# Patient Record
Sex: Female | Born: 1950 | Race: Black or African American | Hispanic: No | Smoking: Never smoker
Health system: Southern US, Community
[De-identification: ages and names within clinical notes are randomized; demographics above are authoritative.]

## PROBLEM LIST (undated history)

## (undated) DIAGNOSIS — J45909 Unspecified asthma, uncomplicated: Secondary | ICD-10-CM

## (undated) DIAGNOSIS — M719 Bursopathy, unspecified: Secondary | ICD-10-CM

## (undated) HISTORY — PX: ECTOPIC PREGNANCY SURGERY: SHX613

## (undated) HISTORY — PX: FOOT SURGERY: SHX648

---

## 2001-07-12 ENCOUNTER — Ambulatory Visit (HOSPITAL_COMMUNITY): Admission: RE | Admit: 2001-07-12 | Discharge: 2001-07-12 | Payer: Self-pay | Admitting: Obstetrics and Gynecology

## 2001-07-12 ENCOUNTER — Encounter: Payer: Self-pay | Admitting: Obstetrics and Gynecology

## 2007-05-01 ENCOUNTER — Ambulatory Visit (HOSPITAL_COMMUNITY): Payer: Self-pay | Admitting: Psychiatry

## 2007-05-08 ENCOUNTER — Ambulatory Visit (HOSPITAL_COMMUNITY): Payer: Self-pay | Admitting: Psychiatry

## 2007-05-17 ENCOUNTER — Ambulatory Visit (HOSPITAL_COMMUNITY): Payer: Self-pay | Admitting: Psychiatry

## 2007-05-24 ENCOUNTER — Ambulatory Visit (HOSPITAL_COMMUNITY): Payer: Self-pay | Admitting: Psychiatry

## 2007-06-15 ENCOUNTER — Ambulatory Visit (HOSPITAL_COMMUNITY): Payer: Self-pay | Admitting: Psychiatry

## 2007-06-28 ENCOUNTER — Ambulatory Visit (HOSPITAL_COMMUNITY): Payer: Self-pay | Admitting: Psychiatry

## 2008-09-12 ENCOUNTER — Ambulatory Visit (HOSPITAL_COMMUNITY): Admission: RE | Admit: 2008-09-12 | Discharge: 2008-09-12 | Payer: Self-pay | Admitting: Family Medicine

## 2009-10-05 ENCOUNTER — Ambulatory Visit (HOSPITAL_COMMUNITY): Admission: RE | Admit: 2009-10-05 | Discharge: 2009-10-05 | Payer: Self-pay | Admitting: Family Medicine

## 2012-12-10 ENCOUNTER — Ambulatory Visit (HOSPITAL_COMMUNITY)
Admission: RE | Admit: 2012-12-10 | Discharge: 2012-12-10 | Disposition: A | Discharge status: Home / Self Care | Payer: Self-pay | Source: Ambulatory Visit | Attending: Family Medicine | Admitting: Family Medicine

## 2012-12-10 ENCOUNTER — Other Ambulatory Visit (HOSPITAL_COMMUNITY): Payer: Self-pay | Admitting: General Practice

## 2012-12-10 DIAGNOSIS — M25552 Pain in left hip: Secondary | ICD-10-CM

## 2012-12-10 DIAGNOSIS — M25559 Pain in unspecified hip: Secondary | ICD-10-CM | POA: Insufficient documentation

## 2014-07-19 IMAGING — CR DG HIP (WITH OR WITHOUT PELVIS) 2-3V*L*
3 series · 3 of 3 positions shown · non-contrast
Comparison: 11/07/2011

CLINICAL DATA: Left hip pain

LEFT HIP - COMPLETE 2+ VIEW

[view not recorded (1 of 3)]
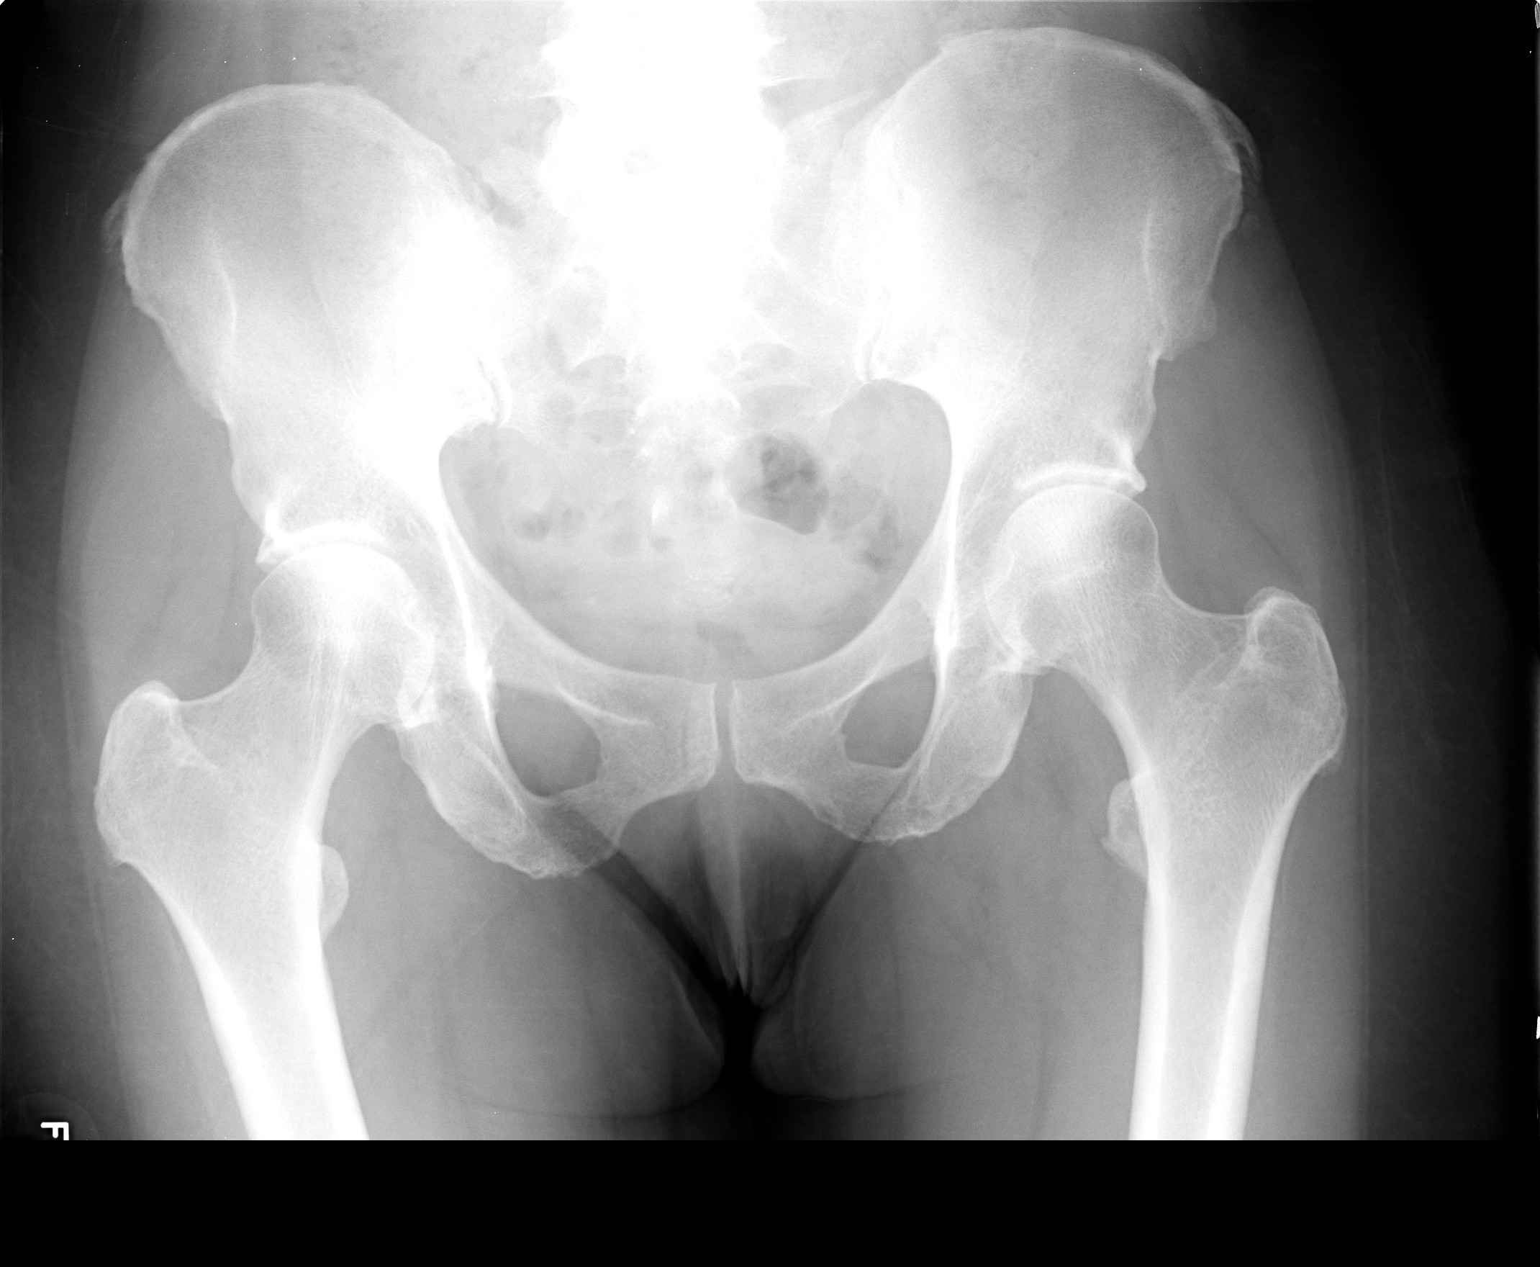

[view not recorded (2 of 3)]
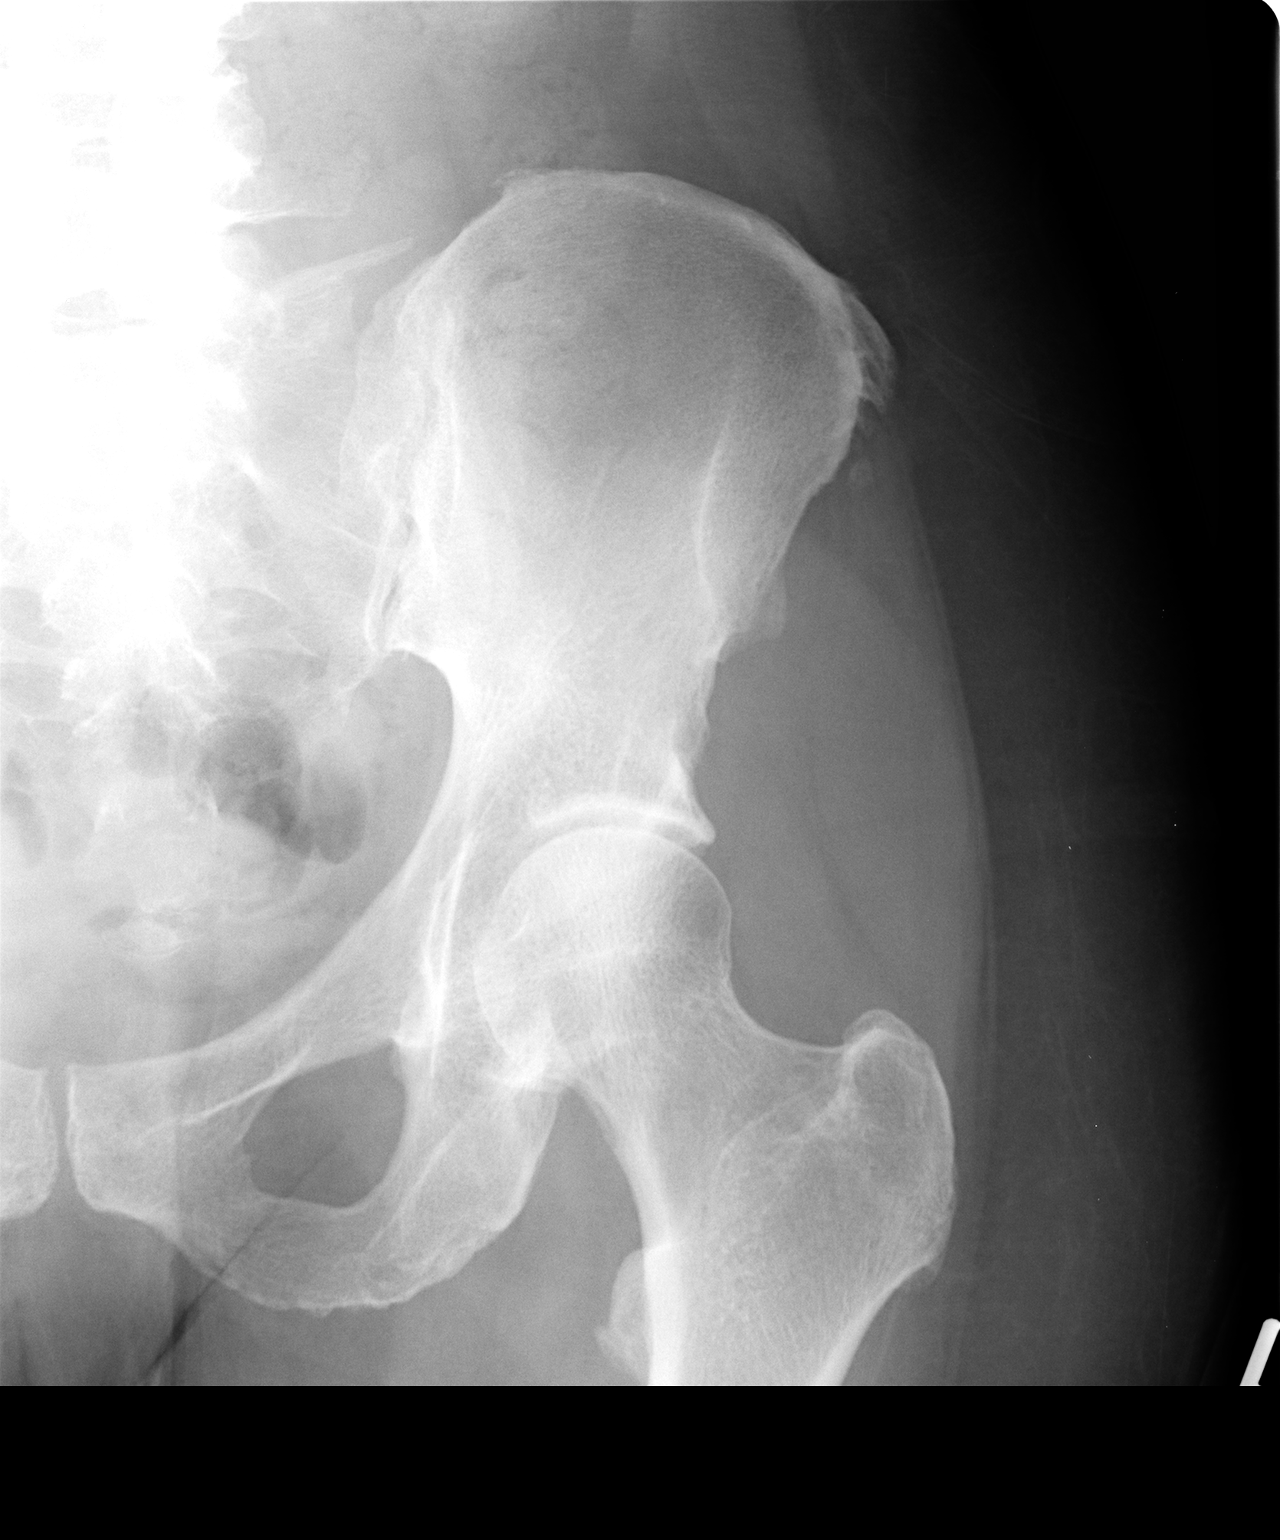

[view not recorded (3 of 3)]
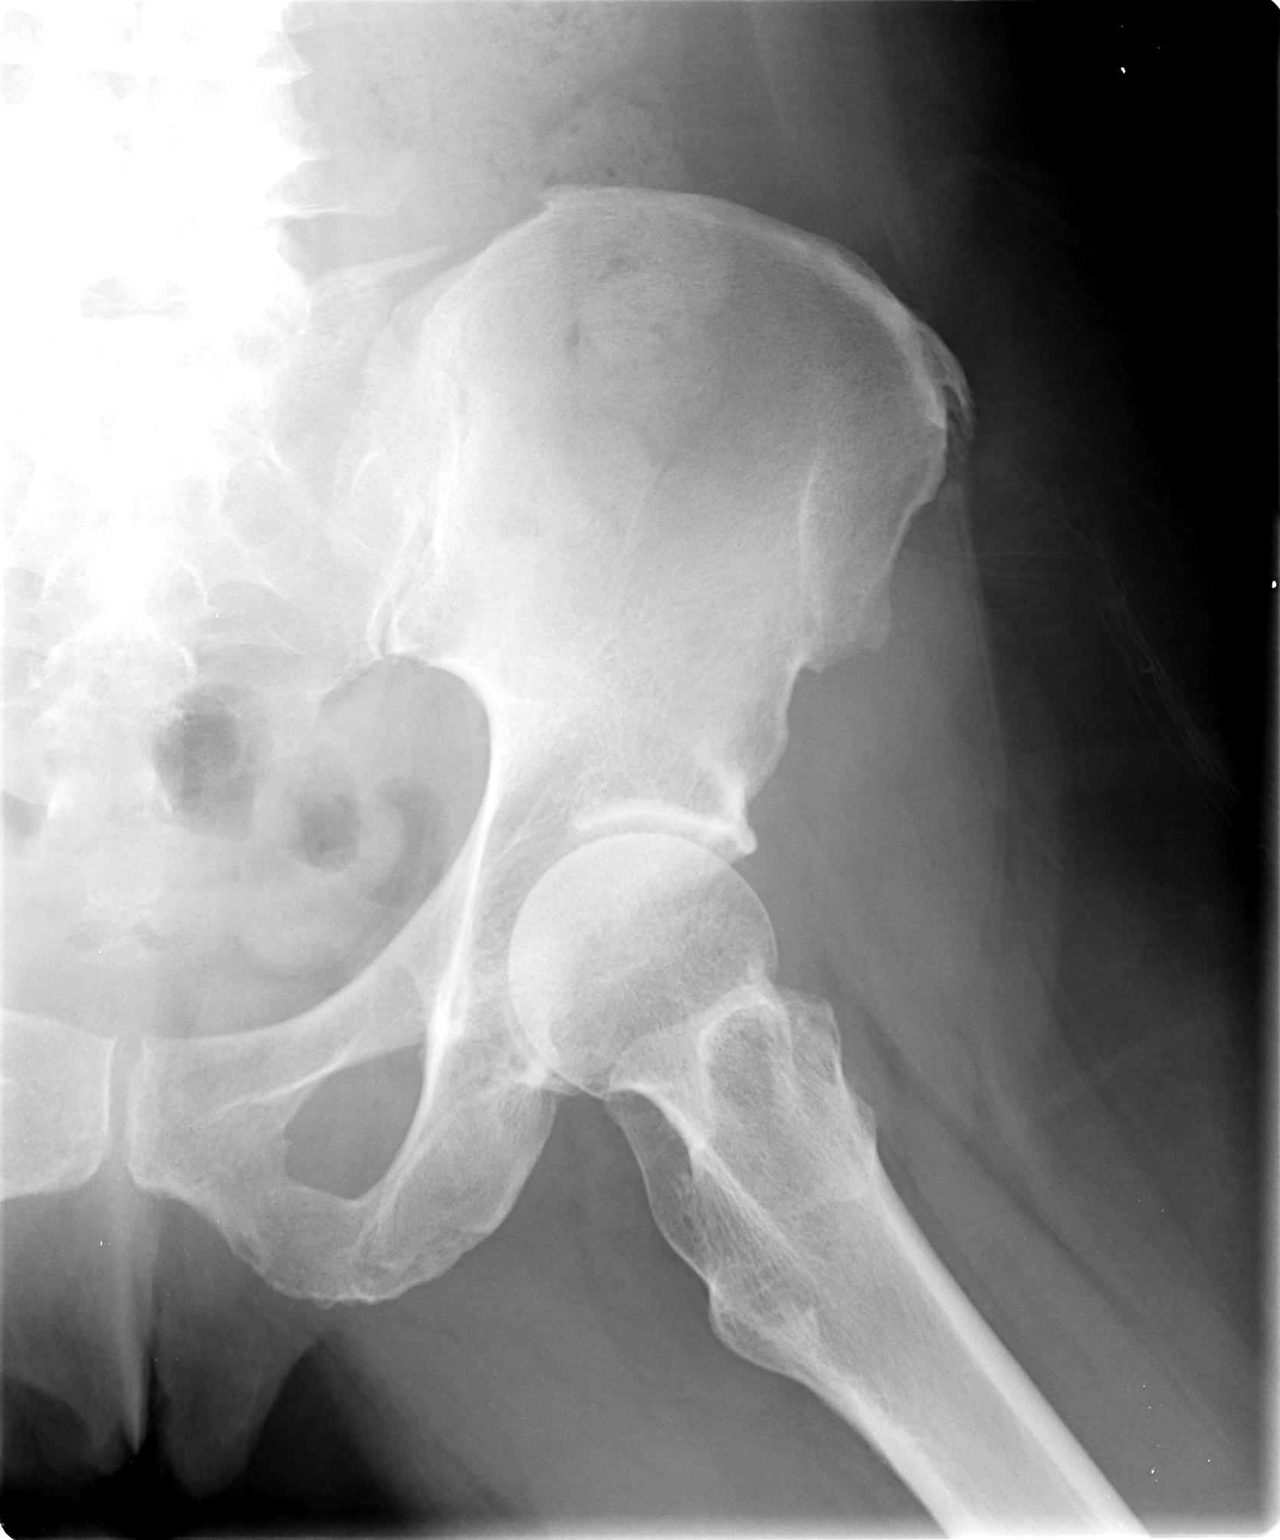

[3 of 3 positions shown; findings below may reference images not displayed]

FINDINGS: Three views of the left hip submitted.  No acute fracture or
subluxation.  Mild degenerative changes bilateral hip joints with
mild narrowing superior joint space.  Mild bilateral superior
acetabular spurring.  Mild spurring of the iliac wings bilaterally.
IMPRESSION: No acute fracture or subluxation.  Mild degenerative changes as
described above.

## 2014-08-14 ENCOUNTER — Emergency Department (HOSPITAL_COMMUNITY)
Admission: EM | Admit: 2014-08-14 | Discharge: 2014-08-14 | Disposition: A | Discharge status: Home / Self Care | Attending: Emergency Medicine | Admitting: Emergency Medicine

## 2014-08-14 ENCOUNTER — Encounter (HOSPITAL_COMMUNITY): Payer: Self-pay | Admitting: *Deleted

## 2014-08-14 DIAGNOSIS — M25552 Pain in left hip: Secondary | ICD-10-CM | POA: Diagnosis not present

## 2014-08-14 DIAGNOSIS — M25511 Pain in right shoulder: Secondary | ICD-10-CM | POA: Diagnosis present

## 2014-08-14 DIAGNOSIS — M2550 Pain in unspecified joint: Secondary | ICD-10-CM

## 2014-08-14 DIAGNOSIS — J45909 Unspecified asthma, uncomplicated: Secondary | ICD-10-CM | POA: Diagnosis not present

## 2014-08-14 HISTORY — DX: Unspecified asthma, uncomplicated: J45.909

## 2014-08-14 HISTORY — DX: Bursopathy, unspecified: M71.9

## 2014-08-14 MED ORDER — KETOROLAC TROMETHAMINE 60 MG/2ML IM SOLN
60.0000 mg | Freq: Once | INTRAMUSCULAR | Status: AC
  Administered 2014-08-14: 60 mg via INTRAMUSCULAR
  Filled 2014-08-14: qty 2

## 2014-08-14 MED ORDER — TRAMADOL HCL 50 MG PO TABS: 50.0000 mg | ORAL_TABLET | Freq: Four times a day (QID) | ORAL | Status: DC | PRN

## 2014-08-14 MED ORDER — PREDNISONE 20 MG PO TABS: 40.0000 mg | ORAL_TABLET | Freq: Every day | ORAL | Status: DC

## 2014-08-14 NOTE — ED Provider Notes (Signed)
CSN: [id number]     Arrival date & time 08/14/14  1321 History   First MD Initiated Contact with Patient 08/14/14 1623     Chief Complaint  Patient presents with  . Shoulder Pain     (Consider location/radiation/quality/duration/timing/severity/associated sxs/prior Treatment) HPI Comments: Patient here complaining of 2 weeks of pain to her left shoulder left hip which are atraumatic. No fever or chills. No history of trauma. History of similar symptoms associated with bursitis and she was treated with tramadol. Denies any paresthesias to her left hand or left leg. No bowel or bladder dysfunction. Symptoms characterized as sharp pain worse with movement and better with rest. Has used over-the-counter medications without relief.  Patient is a 64 y.o. female presenting with shoulder pain. The history is provided by the patient.  Shoulder Pain   Past Medical History  Diagnosis Date  . Asthma   . Bursitis    Past Surgical History  Procedure Laterality Date  . Ectopic pregnancy surgery    . Foot surgery     History reviewed. No pertinent family history. History  Substance Use Topics  . Smoking status: Never Smoker   . Smokeless tobacco: Not on file  . Alcohol Use: No   OB History    No data available     Review of Systems  All other systems reviewed and are negative.     Allergies  Review of patient's allergies indicates no known allergies.  Home Medications   Prior to Admission medications   Not on File   BP 153/76 mmHg  Pulse 78  Temp(Src) 98.3 F (36.8 C) (Oral)  Resp 16  Ht 5\' 2"  (1.575 m)  Wt 124 lb (56.246 kg)  BMI 22.67 kg/m2  SpO2 100% Physical Exam  Constitutional: She is oriented to person, place, and time. She appears well-developed and well-nourished.  Non-toxic appearance. No distress.  HENT:  Head: Normocephalic and atraumatic.  Eyes: Conjunctivae, EOM and lids are normal. Pupils are equal, round, and reactive to light.  Neck: Normal range of  motion. Neck supple. No tracheal deviation present. No thyroid mass present.  Cardiovascular: Normal rate, regular rhythm and normal heart sounds.  Exam reveals no gallop.   No murmur heard. Pulmonary/Chest: Effort normal and breath sounds normal. No stridor. No respiratory distress. She has no decreased breath sounds. She has no wheezes. She has no rhonchi. She has no rales.  Abdominal: Soft. Normal appearance and bowel sounds are normal. She exhibits no distension. There is no tenderness. There is no rebound and no CVA tenderness.  Musculoskeletal: Normal range of motion. She exhibits no edema or tenderness.       Back:  Full range of motion at the shoulder. Skin is normal. Joint is not warm to the touch.    Neurological: She is alert and oriented to person, place, and time. She has normal strength. No cranial nerve deficit or sensory deficit. GCS eye subscore is 4. GCS verbal subscore is 5. GCS motor subscore is 6.  Skin: Skin is warm and dry. No abrasion and no rash noted.  Psychiatric: She has a normal mood and affect. Her speech is normal and behavior is normal.  Nursing note and vitals reviewed.   ED Course  Procedures (including critical care time) Labs Review Labs Reviewed - No data to display  Imaging Review No results found.   EKG Interpretation None      MDM   Final diagnoses:  None    Patient is afebrile here  and has no evidence of septic arthritis. Given shot of Toradol here. Will place on tramadol and prednisone, patient to will follow-up with her doctor.    Lorre Nick, MD 08/14/14 [phone]

## 2014-08-14 NOTE — Discharge Instructions (Signed)
Arthralgia °Your caregiver has diagnosed you as suffering from an arthralgia. Arthralgia means there is pain in a joint. This can come from many reasons including: °· Bruising the joint which causes soreness (inflammation) in the joint. °· Wear and tear on the joints which occur as we grow older (osteoarthritis). °· Overusing the joint. °· Various forms of arthritis. °· Infections of the joint. °Regardless of the cause of pain in your joint, most of these different pains respond to anti-inflammatory drugs and rest. The exception to this is when a joint is infected, and these cases are treated with antibiotics, if it is a bacterial infection. °HOME CARE INSTRUCTIONS  °· Rest the injured area for as long as directed by your caregiver. Then slowly start using the joint as directed by your caregiver and as the pain allows. Crutches as directed may be useful if the ankles, knees or hips are involved. If the knee was splinted or casted, continue use and care as directed. If an stretchy or elastic wrapping bandage has been applied today, it should be removed and re-applied every 3 to 4 hours. It should not be applied tightly, but firmly enough to keep swelling down. Watch toes and feet for swelling, bluish discoloration, coldness, numbness or excessive pain. If any of these problems (symptoms) occur, remove the ace bandage and re-apply more loosely. If these symptoms persist, contact your caregiver or return to this location. °· For the first 24 hours, keep the injured extremity elevated on pillows while lying down. °· Apply ice for 15-20 minutes to the sore joint every couple hours while awake for the first half day. Then 03-04 times per day for the first 48 hours. Put the ice in a plastic bag and place a towel between the bag of ice and your skin. °· Wear any splinting, casting, elastic bandage applications, or slings as instructed. °· Only take over-the-counter or prescription medicines for pain, discomfort, or fever as  directed by your caregiver. Do not use aspirin immediately after the injury unless instructed by your physician. Aspirin can cause increased bleeding and bruising of the tissues. °· If you were given crutches, continue to use them as instructed and do not resume weight bearing on the sore joint until instructed. °Persistent pain and inability to use the sore joint as directed for more than 2 to 3 days are warning signs indicating that you should see a caregiver for a follow-up visit as soon as possible. Initially, a hairline fracture (break in bone) may not be evident on X-rays. Persistent pain and swelling indicate that further evaluation, non-weight bearing or use of the joint (use of crutches or slings as instructed), or further X-rays are indicated. X-rays may sometimes not show a small fracture until a week or 10 days later. Make a follow-up appointment with your own caregiver or one to whom we have referred you. A radiologist (specialist in reading X-rays) may read your X-rays. Make sure you know how you are to obtain your X-ray results. Do not assume everything is normal if you do not hear from us. °SEEK MEDICAL CARE IF: °Bruising, swelling, or pain increases. °SEEK IMMEDIATE MEDICAL CARE IF:  °· Your fingers or toes are numb or blue. °· The pain is not responding to medications and continues to stay the same or get worse. °· The pain in your joint becomes severe. °· You develop a fever over 102° F (38.9° C). °· It becomes impossible to move or use the joint. °MAKE SURE YOU:  °·   Understand these instructions. °· Will watch your condition. °· Will get help right away if you are not doing well or get worse. °Document Released: 04/18/2005 Document Revised: 07/11/2011 Document Reviewed: 12/05/2007 °[employer]® Patient Information ©2015 [employer]. This information is not intended to replace advice given to you by your health care provider. Make sure you discuss any questions you have with your health care  provider. ° °Arthritis, Nonspecific °Arthritis is inflammation of a joint. This usually means pain, redness, warmth or swelling are present. One or more joints may be involved. There are a number of types of arthritis. Your caregiver may not be able to tell what type of arthritis you have right away. °CAUSES  °The most common cause of arthritis is the wear and tear on the joint (osteoarthritis). This causes damage to the cartilage, which can break down over time. The knees, hips, back and neck are most often affected by this type of arthritis. °Other types of arthritis and common causes of joint pain include: °· Sprains and other injuries near the joint. Sometimes minor sprains and injuries cause pain and swelling that develop hours later. °· Rheumatoid arthritis. This affects hands, feet and knees. It usually affects both sides of your body at the same time. It is often associated with chronic ailments, fever, weight loss and general weakness. °· Crystal arthritis. Gout and pseudo gout can cause occasional acute severe pain, redness and swelling in the foot, ankle, or knee. °· Infectious arthritis. Bacteria can get into a joint through a break in overlying skin. This can cause infection of the joint. Bacteria and viruses can also spread through the blood and affect your joints. °· Drug, infectious and allergy reactions. Sometimes joints can become mildly painful and slightly swollen with these types of illnesses. °SYMPTOMS  °· Pain is the main symptom. °· Your joint or joints can also be red, swollen and warm or hot to the touch. °· You may have a fever with certain types of arthritis, or even feel overall ill. °· The joint with arthritis will hurt with movement. Stiffness is present with some types of arthritis. °DIAGNOSIS  °Your caregiver will suspect arthritis based on your description of your symptoms and on your exam. Testing may be needed to find the type of arthritis: °· Blood and sometimes urine  tests. °· X-ray tests and sometimes CT or MRI scans. °· Removal of fluid from the joint (arthrocentesis) is done to check for bacteria, crystals or other causes. Your caregiver (or a specialist) will numb the area over the joint with a local anesthetic, and use a needle to remove joint fluid for examination. This procedure is only minimally uncomfortable. °· Even with these tests, your caregiver may not be able to tell what kind of arthritis you have. Consultation with a specialist (rheumatologist) may be helpful. °TREATMENT  °Your caregiver will discuss with you treatment specific to your type of arthritis. If the specific type cannot be determined, then the following general recommendations may apply. °Treatment of severe joint pain includes: °· Rest. °· Elevation. °· Anti-inflammatory medication (for example, ibuprofen) may be prescribed. Avoiding activities that cause increased pain. °· Only take over-the-counter or prescription medicines for pain and discomfort as recommended by your caregiver. °· Cold packs over an inflamed joint may be used for 10 to 15 minutes every hour. Hot packs sometimes feel better, but do not use overnight. Do not use hot packs if you are diabetic without your caregiver's permission. °· A cortisone shot into arthritic   joints may help reduce pain and swelling. °· Any acute arthritis that gets worse over the next 1 to 2 days needs to be looked at to be sure there is no joint infection. °Long-term arthritis treatment involves modifying activities and lifestyle to reduce joint stress jarring. This can include weight loss. Also, exercise is needed to nourish the joint cartilage and remove waste. This helps keep the muscles around the joint strong. °HOME CARE INSTRUCTIONS  °· Do not take aspirin to relieve pain if gout is suspected. This elevates uric acid levels. °· Only take over-the-counter or prescription medicines for pain, discomfort or fever as directed by your caregiver. °· Rest the  joint as much as possible. °· If your joint is swollen, keep it elevated. °· Use crutches if the painful joint is in your leg. °· Drinking plenty of fluids may help for certain types of arthritis. °· Follow your caregiver's dietary instructions. °· Try low-impact exercise such as: °¨ Swimming. °¨ Water aerobics. °¨ Biking. °¨ Walking. °· Morning stiffness is often relieved by a warm shower. °· Put your joints through regular range-of-motion. °SEEK MEDICAL CARE IF:  °· You do not feel better in 24 hours or are getting worse. °· You have side effects to medications, or are not getting better with treatment. °SEEK IMMEDIATE MEDICAL CARE IF:  °· You have a fever. °· You develop severe joint pain, swelling or redness. °· Many joints are involved and become painful and swollen. °· There is severe back pain and/or leg weakness. °· You have loss of bowel or bladder control. °Document Released: 05/26/2004 Document Revised: 07/11/2011 Document Reviewed: 06/11/2008 °ExitCare® Patient Information ©2015 [employer]. This information is not intended to replace advice given to you by your health care provider. Make sure you discuss any questions you have with your health care provider. ° °

## 2014-08-14 NOTE — ED Notes (Signed)
Pain lt shoulder , and arm , left hip and leg for 2 weeks. No injury.

## 2018-02-15 DIAGNOSIS — Z299 Encounter for prophylactic measures, unspecified: Secondary | ICD-10-CM | POA: Diagnosis not present

## 2018-02-15 DIAGNOSIS — M25552 Pain in left hip: Secondary | ICD-10-CM | POA: Diagnosis not present

## 2018-02-15 DIAGNOSIS — E7800 Pure hypercholesterolemia, unspecified: Secondary | ICD-10-CM | POA: Diagnosis not present

## 2018-02-15 DIAGNOSIS — Z713 Dietary counseling and surveillance: Secondary | ICD-10-CM | POA: Diagnosis not present

## 2018-02-15 DIAGNOSIS — Z6823 Body mass index (BMI) 23.0-23.9, adult: Secondary | ICD-10-CM | POA: Diagnosis not present

## 2018-03-15 DIAGNOSIS — H40013 Open angle with borderline findings, low risk, bilateral: Secondary | ICD-10-CM | POA: Diagnosis not present

## 2018-04-24 DIAGNOSIS — H5310 Unspecified subjective visual disturbances: Secondary | ICD-10-CM | POA: Diagnosis not present

## 2018-07-06 DIAGNOSIS — Z1231 Encounter for screening mammogram for malignant neoplasm of breast: Secondary | ICD-10-CM | POA: Diagnosis not present

## 2018-08-02 DIAGNOSIS — H2513 Age-related nuclear cataract, bilateral: Secondary | ICD-10-CM | POA: Diagnosis not present

## 2019-05-13 DIAGNOSIS — M25512 Pain in left shoulder: Secondary | ICD-10-CM | POA: Diagnosis not present

## 2019-05-13 DIAGNOSIS — Z79899 Other long term (current) drug therapy: Secondary | ICD-10-CM | POA: Diagnosis not present

## 2019-05-13 DIAGNOSIS — Z6824 Body mass index (BMI) 24.0-24.9, adult: Secondary | ICD-10-CM | POA: Diagnosis not present

## 2019-05-13 DIAGNOSIS — Z299 Encounter for prophylactic measures, unspecified: Secondary | ICD-10-CM | POA: Diagnosis not present

## 2019-05-13 DIAGNOSIS — E7800 Pure hypercholesterolemia, unspecified: Secondary | ICD-10-CM | POA: Diagnosis not present

## 2019-08-16 DIAGNOSIS — Z299 Encounter for prophylactic measures, unspecified: Secondary | ICD-10-CM | POA: Diagnosis not present

## 2019-08-16 DIAGNOSIS — M719 Bursopathy, unspecified: Secondary | ICD-10-CM | POA: Diagnosis not present

## 2019-08-16 DIAGNOSIS — Z79899 Other long term (current) drug therapy: Secondary | ICD-10-CM | POA: Diagnosis not present

## 2019-09-16 DIAGNOSIS — Z299 Encounter for prophylactic measures, unspecified: Secondary | ICD-10-CM | POA: Diagnosis not present

## 2019-09-16 DIAGNOSIS — E7800 Pure hypercholesterolemia, unspecified: Secondary | ICD-10-CM | POA: Diagnosis not present

## 2019-09-16 DIAGNOSIS — M25559 Pain in unspecified hip: Secondary | ICD-10-CM | POA: Diagnosis not present

## 2019-10-23 DIAGNOSIS — M79604 Pain in right leg: Secondary | ICD-10-CM | POA: Diagnosis not present

## 2019-10-23 DIAGNOSIS — D649 Anemia, unspecified: Secondary | ICD-10-CM | POA: Diagnosis not present

## 2019-10-23 DIAGNOSIS — M79605 Pain in left leg: Secondary | ICD-10-CM | POA: Diagnosis not present

## 2020-03-20 DIAGNOSIS — Z299 Encounter for prophylactic measures, unspecified: Secondary | ICD-10-CM | POA: Diagnosis not present

## 2020-03-20 DIAGNOSIS — M719 Bursopathy, unspecified: Secondary | ICD-10-CM | POA: Diagnosis not present

## 2020-03-20 DIAGNOSIS — M1990 Unspecified osteoarthritis, unspecified site: Secondary | ICD-10-CM | POA: Diagnosis not present

## 2020-03-20 DIAGNOSIS — Z2821 Immunization not carried out because of patient refusal: Secondary | ICD-10-CM | POA: Diagnosis not present

## 2020-06-09 DIAGNOSIS — M79675 Pain in left toe(s): Secondary | ICD-10-CM | POA: Diagnosis not present

## 2020-06-09 DIAGNOSIS — M79672 Pain in left foot: Secondary | ICD-10-CM | POA: Diagnosis not present

## 2020-06-09 DIAGNOSIS — M2012 Hallux valgus (acquired), left foot: Secondary | ICD-10-CM | POA: Diagnosis not present

## 2020-07-14 DIAGNOSIS — M79672 Pain in left foot: Secondary | ICD-10-CM | POA: Diagnosis not present

## 2020-07-14 DIAGNOSIS — M2022 Hallux rigidus, left foot: Secondary | ICD-10-CM | POA: Diagnosis not present

## 2020-07-21 DIAGNOSIS — Z01818 Encounter for other preprocedural examination: Secondary | ICD-10-CM | POA: Diagnosis not present

## 2020-07-23 DIAGNOSIS — M205X2 Other deformities of toe(s) (acquired), left foot: Secondary | ICD-10-CM | POA: Diagnosis not present

## 2020-07-23 DIAGNOSIS — M2022 Hallux rigidus, left foot: Secondary | ICD-10-CM | POA: Diagnosis not present

## 2020-07-23 DIAGNOSIS — M19072 Primary osteoarthritis, left ankle and foot: Secondary | ICD-10-CM | POA: Diagnosis not present

## 2020-07-23 DIAGNOSIS — M7752 Other enthesopathy of left foot: Secondary | ICD-10-CM | POA: Diagnosis not present

## 2020-08-04 DIAGNOSIS — M79671 Pain in right foot: Secondary | ICD-10-CM | POA: Diagnosis not present

## 2020-08-04 DIAGNOSIS — M2021 Hallux rigidus, right foot: Secondary | ICD-10-CM | POA: Diagnosis not present

## 2020-08-20 DIAGNOSIS — M2022 Hallux rigidus, left foot: Secondary | ICD-10-CM | POA: Diagnosis not present

## 2020-09-07 DIAGNOSIS — Z79899 Other long term (current) drug therapy: Secondary | ICD-10-CM | POA: Diagnosis not present

## 2020-09-07 DIAGNOSIS — Z0000 Encounter for general adult medical examination without abnormal findings: Secondary | ICD-10-CM | POA: Diagnosis not present

## 2020-09-07 DIAGNOSIS — E7800 Pure hypercholesterolemia, unspecified: Secondary | ICD-10-CM | POA: Diagnosis not present

## 2020-09-07 DIAGNOSIS — Z7189 Other specified counseling: Secondary | ICD-10-CM | POA: Diagnosis not present

## 2020-09-07 DIAGNOSIS — Z299 Encounter for prophylactic measures, unspecified: Secondary | ICD-10-CM | POA: Diagnosis not present

## 2020-09-07 DIAGNOSIS — Z789 Other specified health status: Secondary | ICD-10-CM | POA: Diagnosis not present

## 2020-09-07 DIAGNOSIS — Z1231 Encounter for screening mammogram for malignant neoplasm of breast: Secondary | ICD-10-CM | POA: Diagnosis not present

## 2020-09-07 DIAGNOSIS — R5383 Other fatigue: Secondary | ICD-10-CM | POA: Diagnosis not present

## 2020-09-10 DIAGNOSIS — M2022 Hallux rigidus, left foot: Secondary | ICD-10-CM | POA: Diagnosis not present

## 2020-09-25 DIAGNOSIS — Z299 Encounter for prophylactic measures, unspecified: Secondary | ICD-10-CM | POA: Diagnosis not present

## 2020-09-25 DIAGNOSIS — E7800 Pure hypercholesterolemia, unspecified: Secondary | ICD-10-CM | POA: Diagnosis not present

## 2020-09-25 DIAGNOSIS — M810 Age-related osteoporosis without current pathological fracture: Secondary | ICD-10-CM | POA: Diagnosis not present

## 2020-10-05 DIAGNOSIS — Z299 Encounter for prophylactic measures, unspecified: Secondary | ICD-10-CM | POA: Diagnosis not present

## 2020-10-05 DIAGNOSIS — M1990 Unspecified osteoarthritis, unspecified site: Secondary | ICD-10-CM | POA: Diagnosis not present

## 2020-10-05 DIAGNOSIS — M25552 Pain in left hip: Secondary | ICD-10-CM | POA: Diagnosis not present

## 2020-10-08 DIAGNOSIS — Z1231 Encounter for screening mammogram for malignant neoplasm of breast: Secondary | ICD-10-CM | POA: Diagnosis not present

## 2020-10-20 DIAGNOSIS — M79672 Pain in left foot: Secondary | ICD-10-CM | POA: Diagnosis not present

## 2020-10-22 DIAGNOSIS — I700 Atherosclerosis of aorta: Secondary | ICD-10-CM | POA: Diagnosis not present

## 2020-10-22 DIAGNOSIS — E538 Deficiency of other specified B group vitamins: Secondary | ICD-10-CM | POA: Diagnosis not present

## 2020-10-22 DIAGNOSIS — Z299 Encounter for prophylactic measures, unspecified: Secondary | ICD-10-CM | POA: Diagnosis not present

## 2020-10-22 DIAGNOSIS — Z6823 Body mass index (BMI) 23.0-23.9, adult: Secondary | ICD-10-CM | POA: Diagnosis not present

## 2020-10-22 DIAGNOSIS — E559 Vitamin D deficiency, unspecified: Secondary | ICD-10-CM | POA: Diagnosis not present

## 2020-10-22 DIAGNOSIS — Z789 Other specified health status: Secondary | ICD-10-CM | POA: Diagnosis not present

## 2020-10-22 DIAGNOSIS — R252 Cramp and spasm: Secondary | ICD-10-CM | POA: Diagnosis not present

## 2020-10-22 DIAGNOSIS — D509 Iron deficiency anemia, unspecified: Secondary | ICD-10-CM | POA: Diagnosis not present

## 2020-11-06 DIAGNOSIS — I700 Atherosclerosis of aorta: Secondary | ICD-10-CM | POA: Diagnosis not present

## 2020-11-06 DIAGNOSIS — Z299 Encounter for prophylactic measures, unspecified: Secondary | ICD-10-CM | POA: Diagnosis not present

## 2020-11-06 DIAGNOSIS — R252 Cramp and spasm: Secondary | ICD-10-CM | POA: Diagnosis not present

## 2020-11-06 DIAGNOSIS — M25551 Pain in right hip: Secondary | ICD-10-CM | POA: Diagnosis not present

## 2020-11-20 DIAGNOSIS — M25552 Pain in left hip: Secondary | ICD-10-CM | POA: Diagnosis not present

## 2020-11-20 DIAGNOSIS — M5442 Lumbago with sciatica, left side: Secondary | ICD-10-CM | POA: Diagnosis not present

## 2020-12-03 DIAGNOSIS — Z299 Encounter for prophylactic measures, unspecified: Secondary | ICD-10-CM | POA: Diagnosis not present

## 2020-12-03 DIAGNOSIS — M5416 Radiculopathy, lumbar region: Secondary | ICD-10-CM | POA: Diagnosis not present

## 2020-12-03 DIAGNOSIS — Z789 Other specified health status: Secondary | ICD-10-CM | POA: Diagnosis not present

## 2020-12-03 DIAGNOSIS — I739 Peripheral vascular disease, unspecified: Secondary | ICD-10-CM | POA: Diagnosis not present

## 2020-12-10 DIAGNOSIS — H524 Presbyopia: Secondary | ICD-10-CM | POA: Diagnosis not present

## 2020-12-10 DIAGNOSIS — H40013 Open angle with borderline findings, low risk, bilateral: Secondary | ICD-10-CM | POA: Diagnosis not present

## 2020-12-11 DIAGNOSIS — M5116 Intervertebral disc disorders with radiculopathy, lumbar region: Secondary | ICD-10-CM | POA: Diagnosis not present

## 2020-12-11 DIAGNOSIS — M48061 Spinal stenosis, lumbar region without neurogenic claudication: Secondary | ICD-10-CM | POA: Diagnosis not present

## 2020-12-11 DIAGNOSIS — M2580 Other specified joint disorders, unspecified joint: Secondary | ICD-10-CM | POA: Diagnosis not present

## 2020-12-11 DIAGNOSIS — M545 Low back pain, unspecified: Secondary | ICD-10-CM | POA: Diagnosis not present

## 2021-01-06 DIAGNOSIS — Z299 Encounter for prophylactic measures, unspecified: Secondary | ICD-10-CM | POA: Diagnosis not present

## 2021-01-06 DIAGNOSIS — M719 Bursopathy, unspecified: Secondary | ICD-10-CM | POA: Diagnosis not present

## 2021-01-08 DIAGNOSIS — M5442 Lumbago with sciatica, left side: Secondary | ICD-10-CM | POA: Diagnosis not present

## 2021-01-25 DIAGNOSIS — H2511 Age-related nuclear cataract, right eye: Secondary | ICD-10-CM | POA: Diagnosis not present

## 2021-01-25 DIAGNOSIS — H2513 Age-related nuclear cataract, bilateral: Secondary | ICD-10-CM | POA: Diagnosis not present

## 2021-01-25 DIAGNOSIS — H25013 Cortical age-related cataract, bilateral: Secondary | ICD-10-CM | POA: Diagnosis not present

## 2021-01-25 DIAGNOSIS — H524 Presbyopia: Secondary | ICD-10-CM | POA: Diagnosis not present

## 2021-01-25 DIAGNOSIS — H52213 Irregular astigmatism, bilateral: Secondary | ICD-10-CM | POA: Diagnosis not present

## 2021-01-25 DIAGNOSIS — H40023 Open angle with borderline findings, high risk, bilateral: Secondary | ICD-10-CM | POA: Diagnosis not present

## 2021-01-25 DIAGNOSIS — H2589 Other age-related cataract: Secondary | ICD-10-CM | POA: Diagnosis not present

## 2021-03-11 DIAGNOSIS — H40023 Open angle with borderline findings, high risk, bilateral: Secondary | ICD-10-CM | POA: Diagnosis not present

## 2021-04-14 DIAGNOSIS — M1990 Unspecified osteoarthritis, unspecified site: Secondary | ICD-10-CM | POA: Diagnosis not present

## 2021-04-14 DIAGNOSIS — M5416 Radiculopathy, lumbar region: Secondary | ICD-10-CM | POA: Diagnosis not present

## 2021-04-14 DIAGNOSIS — Z299 Encounter for prophylactic measures, unspecified: Secondary | ICD-10-CM | POA: Diagnosis not present

## 2021-04-14 DIAGNOSIS — Z789 Other specified health status: Secondary | ICD-10-CM | POA: Diagnosis not present

## 2021-11-15 DIAGNOSIS — E7800 Pure hypercholesterolemia, unspecified: Secondary | ICD-10-CM | POA: Diagnosis not present

## 2021-11-15 DIAGNOSIS — Z79899 Other long term (current) drug therapy: Secondary | ICD-10-CM | POA: Diagnosis not present

## 2021-11-15 DIAGNOSIS — Z0000 Encounter for general adult medical examination without abnormal findings: Secondary | ICD-10-CM | POA: Diagnosis not present

## 2021-11-26 DIAGNOSIS — Z789 Other specified health status: Secondary | ICD-10-CM | POA: Diagnosis not present

## 2021-11-26 DIAGNOSIS — Z6833 Body mass index (BMI) 33.0-33.9, adult: Secondary | ICD-10-CM | POA: Diagnosis not present

## 2021-11-26 DIAGNOSIS — Z79899 Other long term (current) drug therapy: Secondary | ICD-10-CM | POA: Diagnosis not present

## 2021-11-26 DIAGNOSIS — R5383 Other fatigue: Secondary | ICD-10-CM | POA: Diagnosis not present

## 2021-11-26 DIAGNOSIS — Z299 Encounter for prophylactic measures, unspecified: Secondary | ICD-10-CM | POA: Diagnosis not present

## 2021-11-26 DIAGNOSIS — Z0000 Encounter for general adult medical examination without abnormal findings: Secondary | ICD-10-CM | POA: Diagnosis not present

## 2021-11-26 DIAGNOSIS — Z6823 Body mass index (BMI) 23.0-23.9, adult: Secondary | ICD-10-CM | POA: Diagnosis not present

## 2021-11-26 DIAGNOSIS — E7800 Pure hypercholesterolemia, unspecified: Secondary | ICD-10-CM | POA: Diagnosis not present

## 2021-12-20 DIAGNOSIS — H5213 Myopia, bilateral: Secondary | ICD-10-CM | POA: Diagnosis not present

## 2021-12-20 DIAGNOSIS — H40012 Open angle with borderline findings, low risk, left eye: Secondary | ICD-10-CM | POA: Diagnosis not present

## 2022-02-03 DIAGNOSIS — M5416 Radiculopathy, lumbar region: Secondary | ICD-10-CM | POA: Diagnosis not present

## 2022-02-03 DIAGNOSIS — R5383 Other fatigue: Secondary | ICD-10-CM | POA: Diagnosis not present

## 2022-02-03 DIAGNOSIS — Z299 Encounter for prophylactic measures, unspecified: Secondary | ICD-10-CM | POA: Diagnosis not present

## 2022-04-29 DIAGNOSIS — M810 Age-related osteoporosis without current pathological fracture: Secondary | ICD-10-CM | POA: Diagnosis not present

## 2022-05-06 DIAGNOSIS — I700 Atherosclerosis of aorta: Secondary | ICD-10-CM | POA: Diagnosis not present

## 2022-05-06 DIAGNOSIS — M1990 Unspecified osteoarthritis, unspecified site: Secondary | ICD-10-CM | POA: Diagnosis not present

## 2022-05-06 DIAGNOSIS — Z299 Encounter for prophylactic measures, unspecified: Secondary | ICD-10-CM | POA: Diagnosis not present

## 2022-05-06 DIAGNOSIS — I739 Peripheral vascular disease, unspecified: Secondary | ICD-10-CM | POA: Diagnosis not present

## 2022-06-23 DIAGNOSIS — H40023 Open angle with borderline findings, high risk, bilateral: Secondary | ICD-10-CM | POA: Diagnosis not present

## 2022-08-05 DIAGNOSIS — M5416 Radiculopathy, lumbar region: Secondary | ICD-10-CM | POA: Diagnosis not present

## 2022-08-05 DIAGNOSIS — R252 Cramp and spasm: Secondary | ICD-10-CM | POA: Diagnosis not present

## 2022-08-05 DIAGNOSIS — Z299 Encounter for prophylactic measures, unspecified: Secondary | ICD-10-CM | POA: Diagnosis not present

## 2022-08-05 DIAGNOSIS — R52 Pain, unspecified: Secondary | ICD-10-CM | POA: Diagnosis not present

## 2022-08-05 DIAGNOSIS — M2550 Pain in unspecified joint: Secondary | ICD-10-CM | POA: Diagnosis not present

## 2022-11-10 DIAGNOSIS — Z1231 Encounter for screening mammogram for malignant neoplasm of breast: Secondary | ICD-10-CM | POA: Diagnosis not present

## 2022-11-23 DIAGNOSIS — R928 Other abnormal and inconclusive findings on diagnostic imaging of breast: Secondary | ICD-10-CM | POA: Diagnosis not present

## 2022-11-23 DIAGNOSIS — R92321 Mammographic fibroglandular density, right breast: Secondary | ICD-10-CM | POA: Diagnosis not present

## 2022-11-29 DIAGNOSIS — M25511 Pain in right shoulder: Secondary | ICD-10-CM | POA: Diagnosis not present

## 2022-11-29 DIAGNOSIS — Z0000 Encounter for general adult medical examination without abnormal findings: Secondary | ICD-10-CM | POA: Diagnosis not present

## 2022-11-29 DIAGNOSIS — E7800 Pure hypercholesterolemia, unspecified: Secondary | ICD-10-CM | POA: Diagnosis not present

## 2022-11-29 DIAGNOSIS — Z299 Encounter for prophylactic measures, unspecified: Secondary | ICD-10-CM | POA: Diagnosis not present

## 2022-11-29 DIAGNOSIS — R52 Pain, unspecified: Secondary | ICD-10-CM | POA: Diagnosis not present

## 2022-11-30 DIAGNOSIS — M25511 Pain in right shoulder: Secondary | ICD-10-CM | POA: Diagnosis not present

## 2022-12-09 DIAGNOSIS — M25511 Pain in right shoulder: Secondary | ICD-10-CM | POA: Diagnosis not present

## 2022-12-20 DIAGNOSIS — S43431A Superior glenoid labrum lesion of right shoulder, initial encounter: Secondary | ICD-10-CM | POA: Diagnosis not present

## 2022-12-20 DIAGNOSIS — M7581 Other shoulder lesions, right shoulder: Secondary | ICD-10-CM | POA: Diagnosis not present

## 2022-12-20 DIAGNOSIS — M7521 Bicipital tendinitis, right shoulder: Secondary | ICD-10-CM | POA: Diagnosis not present

## 2022-12-20 DIAGNOSIS — M19011 Primary osteoarthritis, right shoulder: Secondary | ICD-10-CM | POA: Diagnosis not present

## 2022-12-20 DIAGNOSIS — M25511 Pain in right shoulder: Secondary | ICD-10-CM | POA: Diagnosis not present

## 2023-03-02 DIAGNOSIS — H40023 Open angle with borderline findings, high risk, bilateral: Secondary | ICD-10-CM | POA: Diagnosis not present

## 2023-03-02 DIAGNOSIS — H524 Presbyopia: Secondary | ICD-10-CM | POA: Diagnosis not present

## 2023-03-16 DIAGNOSIS — Z0100 Encounter for examination of eyes and vision without abnormal findings: Secondary | ICD-10-CM | POA: Diagnosis not present

## 2023-04-13 DIAGNOSIS — R5383 Other fatigue: Secondary | ICD-10-CM | POA: Diagnosis not present

## 2023-04-13 DIAGNOSIS — M5416 Radiculopathy, lumbar region: Secondary | ICD-10-CM | POA: Diagnosis not present

## 2023-04-13 DIAGNOSIS — Z299 Encounter for prophylactic measures, unspecified: Secondary | ICD-10-CM | POA: Diagnosis not present

## 2023-07-12 DIAGNOSIS — I739 Peripheral vascular disease, unspecified: Secondary | ICD-10-CM | POA: Diagnosis not present

## 2023-07-12 DIAGNOSIS — Z299 Encounter for prophylactic measures, unspecified: Secondary | ICD-10-CM | POA: Diagnosis not present

## 2023-07-12 DIAGNOSIS — Z79899 Other long term (current) drug therapy: Secondary | ICD-10-CM | POA: Diagnosis not present

## 2023-07-12 DIAGNOSIS — M5416 Radiculopathy, lumbar region: Secondary | ICD-10-CM | POA: Diagnosis not present

## 2023-07-12 DIAGNOSIS — I700 Atherosclerosis of aorta: Secondary | ICD-10-CM | POA: Diagnosis not present

## 2023-08-23 DIAGNOSIS — M79605 Pain in left leg: Secondary | ICD-10-CM | POA: Diagnosis not present

## 2023-08-23 DIAGNOSIS — M79662 Pain in left lower leg: Secondary | ICD-10-CM | POA: Diagnosis not present

## 2023-08-23 NOTE — ED Provider Notes (Signed)
 ------------------------------------------------------------------------------- Attestation signed by Cherie Ardeen Hanger, MD at 08/24/23 1734 I was the attending physician on duty, and was available in Emergency Department for any consultations. I did not personally care for this patient. The patient was managed by the mid-level provider. I am co-signing the chart as the attending physician.   Signed by Ardeen SHAUNNA Cherie, MD August 24, 2023 at 5:34 PM  -------------------------------------------------------------------------------                                                                                     Emergency Department Provider Note    ED Clinical Impression   Final diagnoses:  Left leg pain (Primary)    ED Assessment/Plan    Condition: Stable Disposition: Discharge  This chart has been completed using Dragon Medical Dictation software, and while attempts have been made to ensure accuracy, certain words and phrases may not be transcribed as intended.   History   Chief Complaint  Patient presents with  . Leg Pain   HPI  Janet Lamb is a 73 y.o. female  who presents today to the  emergency department complaining of left calf pain x 2 months.   No injury, fever, rash, swelling, numbness.   Pt ambulated from triage w/out difficulty.   Allergies: has no known allergies. Medications: is not on any long-term medications. PMHx:  has a past medical history of Asthma, Bunion, left, Bursitis, and Difficult intravenous access. 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 SocHx:  reports that she has never smoked. She has never used smokeless tobacco. She reports that she does not drink alcohol and does not use drugs. Allergies, Medications, Medical, Surgical, and Social History were reviewed  as documented above.   Social Drivers of Health with Concerns   Food Insecurity: Not on file  Transportation Needs: Not on file  Alcohol Use: Not on file  Housing: Not on file  Physical Activity: Not on file  Utilities: Not on file  Stress: Not on file  Interpersonal Safety: Not on file  Substance Use: Not on file (03/08/2023)  Intimate Partner Violence: Not on file  Social Connections: Not on file  Financial Resource Strain: Not on file  Depression: Not on file  Internet Connectivity: Not on file  Health Literacy: Not on file     Review Of Systems  Review of Systems  Constitutional:  Negative for fever.  Musculoskeletal:  Negative for back pain, gait problem and neck pain.       Left calf pain  Skin:  Negative for rash and wound.  Neurological:  Negative for numbness.    Physical Exam   BP 168/91   Pulse 88   Temp 36.7 C (98 F) (Temporal)   Resp 16   Ht 157.5 cm (5' 2)   Wt 58.2 kg (128 lb 3.2 oz)   SpO2 98%   BMI 23.45 kg/m   Physical Exam Constitutional:      General: She is not in acute distress.    Appearance: She is not ill-appearing.  HENT:     Head: Normocephalic and atraumatic.  Eyes:     Conjunctiva/sclera: Conjunctivae normal.  Musculoskeletal:        General: Tenderness (reports ttp to posterior calf;  no abnormal findings) present.     Cervical back: Neck supple.  Skin:    General: Skin is warm.  Neurological:     Mental Status: She is alert and oriented to person, place, and time.  Psychiatric:        Mood and Affect: Mood normal.     ED Course  Medical Decision Making    Procedures   No results found for this visit on 08/23/23 (from the past 4464 hours).   ED Results No results found for any visits on 08/23/23. PVL Venous Duplex Lower Extremity Left Result Date: 08/23/2023 Exam:  Unilateral Left Lower Extremity DVT Ultrasound Exam  History:  Calf pain  Technique: Standard grayscale compression ultrasound of the left lower  extremity  was performed from the common femoral vein through the popliteal vein to include the proximal end of the greater saphenous vein. The calf veins were also interrogated to the extent that they were visible. This examination was supplemented with color flow and Doppler interrogation.  Comparison:  None  Findings:  The deep venous system of the left lower extremity is widely patent and compressible. There is no evidence of deep venous thrombosis. There is normal phasicity of flow with respiration of the lower extremity. There is appropriate ipsilateral flow augmentation with calf compression. There is no popliteal fossa mass or cyst.    1. Negative for left lower extremity DVT.SABRA   Signed (Electronic Signature): 08/23/2023 3:21 PM Signed By: Norleen Granville, MD  XR Tibia Fibula Left Result Date: 08/23/2023 Exam:  Left Tibia and Fibula  History:  Calf pain  Technique:  2 views  Comparison:  None.  Findings:  Alignment anatomic. No acute fracture identified or radiopaque foreign body. No joint space calcification or aggressive periosteal reaction. Negative for subcutaneous gas.  .    Negative left tibia and fibula.      Signed (Electronic Signature): 08/23/2023 2:05 PM Signed By: John Matzko, MD   Medications Administered:  Medications  HYDROcodone-acetaminophen  (NORCO) 5-325 mg per tablet 1 tablet (has no administration in time range)  ibuprofen (MOTRIN) tablet 800 mg (800 mg Oral Given 08/23/23 1349)  methocarbamol (ROBAXIN) tablet 1,000 mg (1,000 mg Oral Given 08/23/23 1349)    Discharge Medications (Medications Prescribed during this  ED visit and Patient's Home Medications) :    Your Medication List     ASK your doctor about these medications    traMADol  50 mg tablet Commonly known as: ULTRAM  Take 50 mg by mouth every eight (8) hours as needed for pain.          [name], [place] 08/23/23 1545

## 2023-08-23 NOTE — ED Triage Notes (Addendum)
 Patient c/o left leg pain x 2 months and it's getting worse.  Denies injury

## 2023-08-25 DIAGNOSIS — M25572 Pain in left ankle and joints of left foot: Secondary | ICD-10-CM | POA: Diagnosis not present

## 2023-09-05 DIAGNOSIS — H40023 Open angle with borderline findings, high risk, bilateral: Secondary | ICD-10-CM | POA: Diagnosis not present

## 2023-09-11 DIAGNOSIS — M5442 Lumbago with sciatica, left side: Secondary | ICD-10-CM | POA: Diagnosis not present

## 2023-10-16 DIAGNOSIS — M5442 Lumbago with sciatica, left side: Secondary | ICD-10-CM | POA: Diagnosis not present

## 2023-10-17 DIAGNOSIS — M5416 Radiculopathy, lumbar region: Secondary | ICD-10-CM | POA: Diagnosis not present

## 2023-10-17 DIAGNOSIS — M1990 Unspecified osteoarthritis, unspecified site: Secondary | ICD-10-CM | POA: Diagnosis not present

## 2023-10-17 DIAGNOSIS — Z299 Encounter for prophylactic measures, unspecified: Secondary | ICD-10-CM | POA: Diagnosis not present

## 2023-10-25 DIAGNOSIS — Z299 Encounter for prophylactic measures, unspecified: Secondary | ICD-10-CM | POA: Diagnosis not present

## 2023-10-25 DIAGNOSIS — K625 Hemorrhage of anus and rectum: Secondary | ICD-10-CM | POA: Diagnosis not present

## 2023-10-25 DIAGNOSIS — K921 Melena: Secondary | ICD-10-CM | POA: Diagnosis not present

## 2023-10-26 ENCOUNTER — Encounter (INDEPENDENT_AMBULATORY_CARE_PROVIDER_SITE_OTHER): Payer: Self-pay | Admitting: *Deleted

## 2023-11-01 ENCOUNTER — Emergency Department (HOSPITAL_COMMUNITY)
Admission: EM | Admit: 2023-11-01 | Discharge: 2023-11-01 | Disposition: A | Discharge status: Home / Self Care | Attending: Emergency Medicine | Admitting: Emergency Medicine

## 2023-11-01 ENCOUNTER — Encounter (INDEPENDENT_AMBULATORY_CARE_PROVIDER_SITE_OTHER): Payer: Self-pay | Admitting: Gastroenterology

## 2023-11-01 ENCOUNTER — Ambulatory Visit (INDEPENDENT_AMBULATORY_CARE_PROVIDER_SITE_OTHER): Admitting: Gastroenterology

## 2023-11-01 ENCOUNTER — Encounter (HOSPITAL_COMMUNITY): Payer: Self-pay

## 2023-11-01 ENCOUNTER — Other Ambulatory Visit: Payer: Self-pay

## 2023-11-01 VITALS — BP 132/63 | HR 75 | Temp 98.1°F | Ht 62.0 in | Wt 116.8 lb

## 2023-11-01 DIAGNOSIS — M5442 Lumbago with sciatica, left side: Secondary | ICD-10-CM | POA: Insufficient documentation

## 2023-11-01 DIAGNOSIS — M5432 Sciatica, left side: Secondary | ICD-10-CM

## 2023-11-01 DIAGNOSIS — Z791 Long term (current) use of non-steroidal anti-inflammatories (NSAID): Secondary | ICD-10-CM | POA: Insufficient documentation

## 2023-11-01 DIAGNOSIS — K921 Melena: Secondary | ICD-10-CM | POA: Insufficient documentation

## 2023-11-01 DIAGNOSIS — D649 Anemia, unspecified: Secondary | ICD-10-CM

## 2023-11-01 MED ORDER — TRAMADOL HCL 50 MG PO TABS: 50.0000 mg | ORAL_TABLET | Freq: Four times a day (QID) | ORAL | 0 refills | Status: AC | PRN

## 2023-11-01 MED ORDER — METHYLPREDNISOLONE 4 MG PO TBPK: ORAL_TABLET | ORAL | 0 refills | Status: DC

## 2023-11-01 NOTE — Progress Notes (Signed)
 Janet Lamb , M.D. Gastroenterology & Hepatology [facility] Gastroenterology [address] [place], [place] [address] Primary Care Physician: Leavy Waddell NOVAK, FNP [address] [place] [address]  Chief Complaint: Hematochezia  History of Present Illness: Janet Lamb is a 73 y.o. female with sciatica pain on intermittent NSAID who presents for evaluation of painless hematochezia  Patient reports that he for past couple weeks she had noticed 2 episodes of fresh blood upon wiping which has resolved since then.  Patient was taking ibuprofen for sciatica pain but since stopping ibuprofen there has been no fresh blood. The patient denies having any nausea, vomiting, fever, chills, , melena, hematemesis, abdominal distention, abdominal pain, diarrhea, jaundice, pruritus or weight loss.  Labs from 10/2022 hemoglobin 10.4 MCV 92 platelet 273 normal liver enzymes  Last ZHI:wnwz Last Colonoscopy: 2015  FHx: Patient reports liver, lung cancer and leukemia in family Social: neg smoking, alcohol or illicit drug use Surgical: Ectopic pregnancy surgery  Past Medical History: Past Medical History:  Diagnosis Date   Asthma    Bursitis     Past Surgical History: Past Surgical History:  Procedure Laterality Date   ECTOPIC PREGNANCY SURGERY     FOOT SURGERY      Family History: Family History  Problem Relation Age of Onset   Liver cancer Brother     Social History: Social History   Tobacco Use  Smoking Status Never  Smokeless Tobacco Not on file   Social History   Substance and Sexual Activity  Alcohol Use No   Social History   Substance and Sexual Activity  Drug Use No    Allergies: No Known Allergies  Medications: Current Outpatient Medications  Medication Sig Dispense Refill   albuterol (PROVENTIL HFA;VENTOLIN HFA) 108 (90 BASE) MCG/ACT inhaler Inhale 2 puffs into the lungs every 6 (six) hours as needed for wheezing or  shortness of breath.     gabapentin (NEURONTIN) 100 MG capsule Take 100 mg by mouth daily.     traMADol  (ULTRAM ) 50 MG tablet Take 1 tablet (50 mg total) by mouth every 6 (six) hours as needed. 10 tablet 0   No current facility-administered medications for this visit.    Review of Systems: GENERAL: negative for malaise, night sweats HEENT: No changes in hearing or vision, no nose bleeds or other nasal problems. NECK: Negative for lumps, goiter, pain and significant neck swelling RESPIRATORY: Negative for cough, wheezing CARDIOVASCULAR: Negative for chest pain, leg swelling, palpitations, orthopnea GI: SEE HPI MUSCULOSKELETAL: Negative for joint pain or swelling, back pain, and muscle pain. SKIN: Negative for lesions, rash HEMATOLOGY Negative for prolonged bleeding, bruising easily, and swollen nodes. ENDOCRINE: Negative for cold or heat intolerance, polyuria, polydipsia and goiter. NEURO: negative for tremor, gait imbalance, syncope and seizures. The remainder of the review of systems is noncontributory.   Physical Exam: BP 132/63   Pulse 75   Temp 98.1 F (36.7 C)   Ht 5' 2 (1.575 m)   Wt 116 lb 12.8 oz (53 kg)   BMI 21.36 kg/m  GENERAL: The patient is AO x3, in no acute distress. HEENT: Head is normocephalic and atraumatic. EOMI are intact. Mouth is well hydrated and without lesions. NECK: Supple. No masses LUNGS: Clear to auscultation. No presence of rhonchi/wheezing/rales. Adequate chest expansion HEART: RRR, normal s1 and s2. ABDOMEN: Soft, nontender, no guarding, no peritoneal signs, and nondistended. BS +. No masses.   Imaging/Labs: as above      No data  to display         No results found for: IRON, TIBC, FERRITIN  I personally reviewed and interpreted the available labs, imaging and endoscopic files.  Impression and Plan: Janet Lamb is a 73 y.o. female with sciatica pain on intermittent NSAID who presents for evaluation of painless  hematochezia  #Painless Hematochezia #Normocytic Anemia  Patient had self resolving painless hematochezia in setting of NSAID use.  This could be from benign anorectal disease such as hemorrhoidal bleed which has resolved since  Although given advanced age and last colonoscopy over 10 years ago do recommend colonoscopy to ensure there is no polyps, ulceration and rule out malignancy  Stop using high dose aspirin including Goody/BC powders, NSAIDs such as Aleve, ibuprofen, naproxen, Motrin, Voltaren or Advil (even the topical ones)  Ensure adequate fluid intake: Aim for 8 glasses of water daily. Follow a high fiber diet: Include foods such as dates, prunes, pears, and kiwi. Use Metamucil twice a day.  Will plan on colonoscopy  I thoroughly discussed with the patient the procedure, including the risks involved. Patient understands what the procedure involves including the benefits and any risks. Patient understands alternatives to the proposed procedure. Risks including (but not limited to) bleeding, tearing of the lining (perforation), rupture of adjacent organs, problems with heart and lung function, infection, and medication reactions. A small percentage of complications may require surgery, hospitalization, repeat endoscopic procedure, and/or transfusion.  Patient understood and agreed.   Patient appears to have normocytic anemia at least since 2021 with hemoglobin 10.8 and MCV 95  Recommend checking iron level, vitamin B12 and folate level to evaluate cause of normocytic anemia.  Iron deficiency anemia is encountered recommend upper endoscopy as well  All questions were answered.      Katianne Barre Faizan Ashni Lonzo, MD Gastroenterology and Hepatology [facility] Gastroenterology   This chart has been completed using [facility] Dictation software, and while attempts have been made to ensure accuracy , certain words and phrases may not be transcribed as intended

## 2023-11-01 NOTE — ED Triage Notes (Signed)
 Pt arrived via POV c/o left leg pain. Pt reports going to [facility] in April for this pain and believes she has sciatica. Pt reports pain never resolved.

## 2023-11-01 NOTE — ED Provider Notes (Signed)
 [place] EMERGENCY DEPARTMENT AT [facility] Provider Note   CSN: [id number] Arrival date & time: 11/01/23  1237     Patient presents with: Sciatica and Back Pain   Janet Lamb is a 73 y.o. female.    Back Pain Associated symptoms: no abdominal pain, no chest pain, no dysuria, no fever, no numbness and no weakness        Janet Lamb is a 73 y.o. female past medical history of low back pain who presents to the Emergency Department here requesting pain control.  States she has aching pain to her left buttock that radiates down her left leg, sometimes to the level of her calf.  Pain waxes and wanes in severity.  She was previously seen 3 months ago at another ER facility and diagnosed with sciatica.  States that she had x-rays and ultrasound of her leg at that time.  No evidence of blood clot.  Pain worsens with standing and weightbearing, improves at rest.  She denies any known injury, numbness or weakness of her extremity, abdominal pain, urine or bowel changes, fever or chills.  Patient also endorses heavy NSAID use in the past and was having bright red blood from her rectum.  She states when she stopped taking ibuprofen the bleeding from her rectum also stopped.  She states that she has an appointment with her GI provider in 30 minutes  Prior to Admission medications   Medication Sig Start Date End Date Taking? Authorizing Provider  albuterol (PROVENTIL HFA;VENTOLIN HFA) 108 (90 BASE) MCG/ACT inhaler Inhale 2 puffs into the lungs every 6 (six) hours as needed for wheezing or shortness of breath.    [provider]  predniSONE  (DELTASONE ) 20 MG tablet Take 2 tablets (40 mg total) by mouth daily. 08/14/14   Dasie Faden, MD  traMADol  (ULTRAM ) 50 MG tablet Take 1 tablet (50 mg total) by mouth every 6 (six) hours as needed. 08/14/14   Dasie Faden, MD    Allergies: Patient has no known allergies.    Review of Systems  Constitutional:  Negative for chills  and fever.  Respiratory:  Negative for shortness of breath.   Cardiovascular:  Negative for chest pain.  Gastrointestinal:  Negative for abdominal pain, nausea and vomiting.  Genitourinary:  Negative for dysuria and flank pain.  Musculoskeletal:  Positive for back pain.  Skin:  Negative for rash.  Neurological:  Negative for weakness and numbness.    Updated Vital Signs BP (!) 156/67 (BP Location: Right Arm)   Pulse 93   Temp 97.8 F (36.6 C) (Temporal)   Resp 18   Ht 5' 2 (1.575 m)   Wt 56.2 kg   SpO2 100%   BMI 22.66 kg/m   Physical Exam Vitals and nursing note reviewed.  Constitutional:      General: She is not in acute distress.    Appearance: Normal appearance. She is not ill-appearing or toxic-appearing.  Cardiovascular:     Rate and Rhythm: Normal rate and regular rhythm.     Pulses: Normal pulses.  Pulmonary:     Effort: Pulmonary effort is normal.  Abdominal:     General: There is no distension.     Palpations: Abdomen is soft.  Musculoskeletal:        General: Normal range of motion.     Comments: Tender to palpation left SI joint space area.  No midline tenderness or bony step-off.  Positive straight leg raise on the left  Skin:  General: Skin is warm.     Capillary Refill: Capillary refill takes less than 2 seconds.  Neurological:     General: No focal deficit present.     Mental Status: She is alert.     Sensory: No sensory deficit.     Motor: No weakness.     (all labs ordered are listed, but only abnormal results are displayed) Labs Reviewed - No data to display  EKG: None  Radiology: No results found.   Procedures   Medications Ordered in the ED - No data to display                                  Medical Decision Making Patient here with left-sided low back pain since April no known injury.  Continues to have aching pain of her left low back hip that radiates into her leg.  Pain has been waxing and waning in severity.  She gets  some relief with position change.  It is worse with standing or walking.  No numbness or weakness of her extremities.  No saddle anesthesias.  Patient does endorse having some bright red blood from her rectum and admits to heavy anti-inflammatory use in the past but states the bleeding stopped shortly after she stopped taking ibuprofen.  She has a GI appointment in 30 minutes for evaluation of her rectal bleeding.  Amount and/or Complexity of Data Reviewed Radiology: ordered.    Details: Tib-fib film and duplex results from April at [facility] were reviewed by me Discussion of management or test interpretation with external provider(s): Patient ambulatory here no focal neurodeficit, no red flags on exam to suggest a cauda equina.  No swelling redness or calf pain to suggest DVT.  Suspect sciatica.  I have offered additional imaging and workup here for patient's hematochezia and back and leg pain.  She declines, stating that she would prefer pain medication and to leave so that she may keep her GI appointment.  States that she has taken tramadol  in the past that provided some relief.  I have recommended that she discontinue any and all NSAIDs.  She is also been evaluated for this by her PCP  Short course of tramadol , database reviewed, will also try steroid taper.  She agrees to close outpatient follow-up with her PCP.  I have given strict return precautions as well.  Risk Prescription drug management.        Final diagnoses:  Sciatica of left side    ED Discharge Orders     None          Herlinda Milling, PA-C 11/03/23 1726    Bernard Drivers, MD 11/05/23 1816

## 2023-11-01 NOTE — Discharge Instructions (Signed)
 You may try over-the-counter 4% lidocaine patches applied to the left lower back area.  Avoid bending twisting or heavy lifting.  Take the medication as directed until finished.  Follow-up with your primary care provider for recheck.  Return to the emergency department for any new or worsening symptoms.

## 2023-11-01 NOTE — Patient Instructions (Signed)
 It was very nice to meet you today, as dicussed with will plan for the following :  1) colonoscopy  2)Stop using high dose aspirin including Goody/BC powders, NSAIDs such as Aleve, ibuprofen, naproxen, Motrin, Voltaren or Advil (even the topical ones)

## 2023-11-07 DIAGNOSIS — M5416 Radiculopathy, lumbar region: Secondary | ICD-10-CM | POA: Diagnosis not present

## 2023-11-07 DIAGNOSIS — I700 Atherosclerosis of aorta: Secondary | ICD-10-CM | POA: Diagnosis not present

## 2023-11-07 DIAGNOSIS — Z299 Encounter for prophylactic measures, unspecified: Secondary | ICD-10-CM | POA: Diagnosis not present

## 2023-11-08 ENCOUNTER — Telehealth: Payer: Self-pay | Admitting: *Deleted

## 2023-11-08 MED ORDER — PEG 3350-KCL-NA BICARB-NACL 420 G PO SOLR: 4000.0000 mL | Freq: Once | ORAL | 0 refills | Status: AC

## 2023-11-08 NOTE — Telephone Encounter (Signed)
 Called pt. She has been scheduled for TCS with Dr. Cinderella 8/13. Aware will send instructions to her. Rx for prep sent to pharmacy.    Per cohere Information about your requested care Prior authorization is not required for this code. If you would like to submit this code for review, please login to [employer], or contact [employer]; for Medicare call [phone], for Commercial call [phone]

## 2023-11-13 ENCOUNTER — Telehealth (INDEPENDENT_AMBULATORY_CARE_PROVIDER_SITE_OTHER): Payer: Self-pay | Admitting: *Deleted

## 2023-11-13 NOTE — Telephone Encounter (Signed)
 Pt called in to cancel procedure for 8/13 for now. Reports she has other appointments at this time and will call back at a later date to reschedule

## 2023-11-28 DIAGNOSIS — Z1231 Encounter for screening mammogram for malignant neoplasm of breast: Secondary | ICD-10-CM | POA: Diagnosis not present

## 2023-12-08 DIAGNOSIS — M5416 Radiculopathy, lumbar region: Secondary | ICD-10-CM | POA: Diagnosis not present

## 2023-12-13 ENCOUNTER — Ambulatory Visit (HOSPITAL_COMMUNITY): Admit: 2023-12-13 | Admitting: Gastroenterology

## 2023-12-13 ENCOUNTER — Encounter (HOSPITAL_COMMUNITY): Payer: Self-pay

## 2023-12-13 SURGERY — COLONOSCOPY
Anesthesia: Choice

## 2023-12-18 DIAGNOSIS — M5432 Sciatica, left side: Secondary | ICD-10-CM | POA: Diagnosis not present

## 2023-12-18 DIAGNOSIS — M4726 Other spondylosis with radiculopathy, lumbar region: Secondary | ICD-10-CM | POA: Diagnosis not present

## 2023-12-18 DIAGNOSIS — M5431 Sciatica, right side: Secondary | ICD-10-CM | POA: Diagnosis not present

## 2024-01-05 DIAGNOSIS — M48062 Spinal stenosis, lumbar region with neurogenic claudication: Secondary | ICD-10-CM | POA: Diagnosis not present

## 2024-01-05 DIAGNOSIS — M4807 Spinal stenosis, lumbosacral region: Secondary | ICD-10-CM | POA: Diagnosis not present

## 2024-01-05 DIAGNOSIS — M4726 Other spondylosis with radiculopathy, lumbar region: Secondary | ICD-10-CM | POA: Diagnosis not present

## 2024-01-05 DIAGNOSIS — M545 Low back pain, unspecified: Secondary | ICD-10-CM | POA: Diagnosis not present

## 2024-01-05 DIAGNOSIS — M48061 Spinal stenosis, lumbar region without neurogenic claudication: Secondary | ICD-10-CM | POA: Diagnosis not present

## 2024-01-23 DIAGNOSIS — M5432 Sciatica, left side: Secondary | ICD-10-CM | POA: Diagnosis not present

## 2024-01-23 DIAGNOSIS — M48062 Spinal stenosis, lumbar region with neurogenic claudication: Secondary | ICD-10-CM | POA: Diagnosis not present

## 2024-01-23 DIAGNOSIS — M5431 Sciatica, right side: Secondary | ICD-10-CM | POA: Diagnosis not present

## 2024-01-23 DIAGNOSIS — M4316 Spondylolisthesis, lumbar region: Secondary | ICD-10-CM | POA: Diagnosis not present

## 2024-01-23 DIAGNOSIS — Z1330 Encounter for screening examination for mental health and behavioral disorders, unspecified: Secondary | ICD-10-CM | POA: Diagnosis not present

## 2024-01-23 DIAGNOSIS — M4726 Other spondylosis with radiculopathy, lumbar region: Secondary | ICD-10-CM | POA: Diagnosis not present

## 2024-02-07 DIAGNOSIS — Z2821 Immunization not carried out because of patient refusal: Secondary | ICD-10-CM | POA: Diagnosis not present

## 2024-02-07 DIAGNOSIS — M5416 Radiculopathy, lumbar region: Secondary | ICD-10-CM | POA: Diagnosis not present

## 2024-02-07 DIAGNOSIS — R52 Pain, unspecified: Secondary | ICD-10-CM | POA: Diagnosis not present

## 2024-02-07 DIAGNOSIS — Z299 Encounter for prophylactic measures, unspecified: Secondary | ICD-10-CM | POA: Diagnosis not present

## 2024-02-07 DIAGNOSIS — I700 Atherosclerosis of aorta: Secondary | ICD-10-CM | POA: Diagnosis not present

## 2024-02-09 DIAGNOSIS — M48062 Spinal stenosis, lumbar region with neurogenic claudication: Secondary | ICD-10-CM | POA: Diagnosis not present

## 2024-02-09 DIAGNOSIS — M5431 Sciatica, right side: Secondary | ICD-10-CM | POA: Diagnosis not present

## 2024-02-09 DIAGNOSIS — M4316 Spondylolisthesis, lumbar region: Secondary | ICD-10-CM | POA: Diagnosis not present

## 2024-02-09 DIAGNOSIS — M4726 Other spondylosis with radiculopathy, lumbar region: Secondary | ICD-10-CM | POA: Diagnosis not present

## 2024-02-09 DIAGNOSIS — M5432 Sciatica, left side: Secondary | ICD-10-CM | POA: Diagnosis not present

## 2024-02-21 DIAGNOSIS — J45909 Unspecified asthma, uncomplicated: Secondary | ICD-10-CM | POA: Diagnosis not present

## 2024-02-21 DIAGNOSIS — Z79899 Other long term (current) drug therapy: Secondary | ICD-10-CM | POA: Diagnosis not present

## 2024-02-21 DIAGNOSIS — Z539 Procedure and treatment not carried out, unspecified reason: Secondary | ICD-10-CM | POA: Diagnosis not present

## 2024-02-21 DIAGNOSIS — M48062 Spinal stenosis, lumbar region with neurogenic claudication: Secondary | ICD-10-CM | POA: Diagnosis not present

## 2024-02-21 DIAGNOSIS — Z136 Encounter for screening for cardiovascular disorders: Secondary | ICD-10-CM | POA: Diagnosis not present

## 2024-02-22 DIAGNOSIS — M5432 Sciatica, left side: Secondary | ICD-10-CM | POA: Diagnosis not present

## 2024-02-22 DIAGNOSIS — M2578 Osteophyte, vertebrae: Secondary | ICD-10-CM | POA: Diagnosis not present

## 2024-02-22 DIAGNOSIS — D649 Anemia, unspecified: Secondary | ICD-10-CM | POA: Diagnosis not present

## 2024-02-22 DIAGNOSIS — M4326 Fusion of spine, lumbar region: Secondary | ICD-10-CM | POA: Diagnosis not present

## 2024-02-22 DIAGNOSIS — J45909 Unspecified asthma, uncomplicated: Secondary | ICD-10-CM | POA: Diagnosis not present

## 2024-02-22 DIAGNOSIS — G709 Myoneural disorder, unspecified: Secondary | ICD-10-CM | POA: Diagnosis not present

## 2024-02-22 DIAGNOSIS — M5431 Sciatica, right side: Secondary | ICD-10-CM | POA: Diagnosis not present

## 2024-02-22 DIAGNOSIS — M4316 Spondylolisthesis, lumbar region: Secondary | ICD-10-CM | POA: Diagnosis not present

## 2024-02-22 DIAGNOSIS — M4726 Other spondylosis with radiculopathy, lumbar region: Secondary | ICD-10-CM | POA: Diagnosis not present

## 2024-02-22 DIAGNOSIS — M48062 Spinal stenosis, lumbar region with neurogenic claudication: Secondary | ICD-10-CM | POA: Diagnosis not present

## 2024-02-23 DIAGNOSIS — J45909 Unspecified asthma, uncomplicated: Secondary | ICD-10-CM | POA: Diagnosis not present

## 2024-02-23 DIAGNOSIS — M5116 Intervertebral disc disorders with radiculopathy, lumbar region: Secondary | ICD-10-CM | POA: Diagnosis not present

## 2024-02-23 DIAGNOSIS — M48062 Spinal stenosis, lumbar region with neurogenic claudication: Secondary | ICD-10-CM | POA: Diagnosis not present

## 2024-02-23 DIAGNOSIS — G709 Myoneural disorder, unspecified: Secondary | ICD-10-CM | POA: Diagnosis not present

## 2024-02-23 DIAGNOSIS — M4316 Spondylolisthesis, lumbar region: Secondary | ICD-10-CM | POA: Diagnosis not present

## 2024-02-23 DIAGNOSIS — D649 Anemia, unspecified: Secondary | ICD-10-CM | POA: Diagnosis not present

## 2024-02-26 DIAGNOSIS — M4316 Spondylolisthesis, lumbar region: Secondary | ICD-10-CM | POA: Diagnosis not present

## 2024-02-26 DIAGNOSIS — M5432 Sciatica, left side: Secondary | ICD-10-CM | POA: Diagnosis not present

## 2024-02-26 DIAGNOSIS — M5431 Sciatica, right side: Secondary | ICD-10-CM | POA: Diagnosis not present

## 2024-02-28 DIAGNOSIS — Z4789 Encounter for other orthopedic aftercare: Secondary | ICD-10-CM | POA: Diagnosis not present

## 2024-02-28 DIAGNOSIS — J45909 Unspecified asthma, uncomplicated: Secondary | ICD-10-CM | POA: Diagnosis not present

## 2024-02-28 DIAGNOSIS — Z9181 History of falling: Secondary | ICD-10-CM | POA: Diagnosis not present

## 2024-02-28 DIAGNOSIS — Z981 Arthrodesis status: Secondary | ICD-10-CM | POA: Diagnosis not present

## 2024-02-29 DIAGNOSIS — Z833 Family history of diabetes mellitus: Secondary | ICD-10-CM | POA: Diagnosis not present

## 2024-02-29 DIAGNOSIS — M5430 Sciatica, unspecified side: Secondary | ICD-10-CM | POA: Diagnosis not present

## 2024-02-29 DIAGNOSIS — N189 Chronic kidney disease, unspecified: Secondary | ICD-10-CM | POA: Diagnosis not present

## 2024-02-29 DIAGNOSIS — R030 Elevated blood-pressure reading, without diagnosis of hypertension: Secondary | ICD-10-CM | POA: Diagnosis not present

## 2024-02-29 DIAGNOSIS — G519 Disorder of facial nerve, unspecified: Secondary | ICD-10-CM | POA: Diagnosis not present

## 2024-02-29 DIAGNOSIS — Z8249 Family history of ischemic heart disease and other diseases of the circulatory system: Secondary | ICD-10-CM | POA: Diagnosis not present

## 2024-02-29 DIAGNOSIS — J45909 Unspecified asthma, uncomplicated: Secondary | ICD-10-CM | POA: Diagnosis not present

## 2024-03-01 DIAGNOSIS — Z9181 History of falling: Secondary | ICD-10-CM | POA: Diagnosis not present

## 2024-03-01 DIAGNOSIS — Z4789 Encounter for other orthopedic aftercare: Secondary | ICD-10-CM | POA: Diagnosis not present

## 2024-03-01 DIAGNOSIS — Z981 Arthrodesis status: Secondary | ICD-10-CM | POA: Diagnosis not present

## 2024-03-01 DIAGNOSIS — J45909 Unspecified asthma, uncomplicated: Secondary | ICD-10-CM | POA: Diagnosis not present

## 2024-03-07 DIAGNOSIS — Z4789 Encounter for other orthopedic aftercare: Secondary | ICD-10-CM | POA: Diagnosis not present

## 2024-03-07 DIAGNOSIS — Z981 Arthrodesis status: Secondary | ICD-10-CM | POA: Diagnosis not present

## 2024-03-07 DIAGNOSIS — Z9181 History of falling: Secondary | ICD-10-CM | POA: Diagnosis not present

## 2024-03-07 DIAGNOSIS — J45909 Unspecified asthma, uncomplicated: Secondary | ICD-10-CM | POA: Diagnosis not present

## 2024-03-08 DIAGNOSIS — Z9181 History of falling: Secondary | ICD-10-CM | POA: Diagnosis not present

## 2024-03-08 DIAGNOSIS — Z981 Arthrodesis status: Secondary | ICD-10-CM | POA: Diagnosis not present

## 2024-03-08 DIAGNOSIS — J45909 Unspecified asthma, uncomplicated: Secondary | ICD-10-CM | POA: Diagnosis not present

## 2024-03-11 DIAGNOSIS — Z9181 History of falling: Secondary | ICD-10-CM | POA: Diagnosis not present

## 2024-03-11 DIAGNOSIS — Z981 Arthrodesis status: Secondary | ICD-10-CM | POA: Diagnosis not present

## 2024-03-11 DIAGNOSIS — J45909 Unspecified asthma, uncomplicated: Secondary | ICD-10-CM | POA: Diagnosis not present

## 2024-03-13 DIAGNOSIS — Z981 Arthrodesis status: Secondary | ICD-10-CM | POA: Diagnosis not present

## 2024-03-13 DIAGNOSIS — J45909 Unspecified asthma, uncomplicated: Secondary | ICD-10-CM | POA: Diagnosis not present

## 2024-03-13 DIAGNOSIS — Z9181 History of falling: Secondary | ICD-10-CM | POA: Diagnosis not present

## 2024-03-14 DIAGNOSIS — Z981 Arthrodesis status: Secondary | ICD-10-CM | POA: Diagnosis not present

## 2024-03-14 DIAGNOSIS — J45909 Unspecified asthma, uncomplicated: Secondary | ICD-10-CM | POA: Diagnosis not present

## 2024-03-14 DIAGNOSIS — Z9181 History of falling: Secondary | ICD-10-CM | POA: Diagnosis not present

## 2024-03-18 DIAGNOSIS — Z4789 Encounter for other orthopedic aftercare: Secondary | ICD-10-CM | POA: Diagnosis not present

## 2024-03-19 DIAGNOSIS — Z4789 Encounter for other orthopedic aftercare: Secondary | ICD-10-CM | POA: Diagnosis not present

## 2024-03-20 DIAGNOSIS — J45909 Unspecified asthma, uncomplicated: Secondary | ICD-10-CM | POA: Diagnosis not present

## 2024-03-20 DIAGNOSIS — Z9181 History of falling: Secondary | ICD-10-CM | POA: Diagnosis not present

## 2024-03-20 DIAGNOSIS — Z981 Arthrodesis status: Secondary | ICD-10-CM | POA: Diagnosis not present

## 2024-03-20 DIAGNOSIS — Z4789 Encounter for other orthopedic aftercare: Secondary | ICD-10-CM | POA: Diagnosis not present

## 2024-03-21 DIAGNOSIS — Z4789 Encounter for other orthopedic aftercare: Secondary | ICD-10-CM | POA: Diagnosis not present

## 2024-03-21 DIAGNOSIS — J45909 Unspecified asthma, uncomplicated: Secondary | ICD-10-CM | POA: Diagnosis not present

## 2024-03-21 DIAGNOSIS — Z9181 History of falling: Secondary | ICD-10-CM | POA: Diagnosis not present

## 2024-03-21 DIAGNOSIS — Z981 Arthrodesis status: Secondary | ICD-10-CM | POA: Diagnosis not present

## 2024-03-26 DIAGNOSIS — M4316 Spondylolisthesis, lumbar region: Secondary | ICD-10-CM | POA: Diagnosis not present
